# Patient Record
Sex: Female | Born: 1975 | Hispanic: Yes | Marital: Married | Smoking: Current every day smoker
Health system: Southern US, Community
[De-identification: ages and names within clinical notes are randomized; demographics above are authoritative.]

## PROBLEM LIST (undated history)

## (undated) DIAGNOSIS — I10 Essential (primary) hypertension: Secondary | ICD-10-CM

## (undated) DIAGNOSIS — E8881 Metabolic syndrome: Secondary | ICD-10-CM

## (undated) DIAGNOSIS — E88819 Insulin resistance, unspecified: Secondary | ICD-10-CM

## (undated) DIAGNOSIS — E079 Disorder of thyroid, unspecified: Secondary | ICD-10-CM

## (undated) HISTORY — DX: Disorder of thyroid, unspecified: E07.9

## (undated) HISTORY — PX: THYROID SURGERY: SHX805

---

## 2012-09-09 ENCOUNTER — Inpatient Hospital Stay (HOSPITAL_COMMUNITY)
Admission: AD | Admit: 2012-09-09 | Discharge: 2012-09-09 | Disposition: A | Discharge status: Home / Self Care | Payer: Self-pay | Source: Ambulatory Visit | Attending: Obstetrics & Gynecology | Admitting: Obstetrics & Gynecology

## 2012-09-09 ENCOUNTER — Encounter (HOSPITAL_COMMUNITY): Payer: Self-pay

## 2012-09-09 DIAGNOSIS — N898 Other specified noninflammatory disorders of vagina: Secondary | ICD-10-CM

## 2012-09-09 DIAGNOSIS — N949 Unspecified condition associated with female genital organs and menstrual cycle: Secondary | ICD-10-CM | POA: Insufficient documentation

## 2012-09-09 DIAGNOSIS — N938 Other specified abnormal uterine and vaginal bleeding: Secondary | ICD-10-CM | POA: Insufficient documentation

## 2012-09-09 DIAGNOSIS — N939 Abnormal uterine and vaginal bleeding, unspecified: Secondary | ICD-10-CM

## 2012-09-09 HISTORY — DX: Essential (primary) hypertension: I10

## 2012-09-09 LAB — CBC
HCT: 37.7 % (ref 36.0–46.0)
MCV: 86.7 fL (ref 78.0–100.0)
Platelets: 370 10*3/uL (ref 150–400)
RBC: 4.35 MIL/uL (ref 3.87–5.11)
WBC: 11.4 10*3/uL — ABNORMAL HIGH (ref 4.0–10.5)

## 2012-09-09 LAB — POCT PREGNANCY, URINE: Preg Test, Ur: NEGATIVE

## 2012-09-09 MED ORDER — NAPROXEN 500 MG PO TABS: 500.0000 mg | ORAL_TABLET | Freq: Two times a day (BID) | ORAL | Status: DC

## 2012-09-09 NOTE — MAU Provider Note (Signed)
Attestation of Attending Supervision of Advanced Practitioner (CNM/NP): Evaluation and management procedures were performed by the Advanced Practitioner under my supervision and collaboration. I have reviewed the Advanced Practitioner's note and chart, and I agree with the management and plan.  Elesa Garman H. 4:49 PM   

## 2012-09-09 NOTE — MAU Provider Note (Signed)
History     CSN: [id number]  Arrival date and time: 09/09/12 1139   None     Chief Complaint  Patient presents with  . Vaginal Bleeding   HPI This is a 37 y.o. female who presents with c/o bleeding since getting her first DepoProvera shot in April. Denies dizziness. Soaks Minipads, no active hemorrhage. States no one told her this might happen.   RN Note: Patient got DepoProvera on 08/17/12 has had bleeding since then.       OB History   Grav Para Term Preterm Abortions TAB SAB Ect Mult Living   1 1 1       1       Past Medical History  Diagnosis Date  . Hypertension     No past surgical history on file.  No family history on file.  History  Substance Use Topics  . Smoking status: Current Every Day Smoker -- 10 years    Types: Cigarettes  . Smokeless tobacco: Not on file  . Alcohol Use: Yes     Comment: beer    Allergies:  Allergies  Allergen Reactions  . Shellfish Allergy Anaphylaxis    Prescriptions prior to admission  Medication Sig Dispense Refill  . metFORMIN (GLUCOPHAGE) 1000 MG tablet Take 1,000 mg by mouth daily.      . valsartan (DIOVAN) 160 MG tablet Take 160 mg by mouth daily.        Review of Systems  Constitutional: Negative for fever and malaise/fatigue.  Gastrointestinal: Negative for nausea, vomiting, abdominal pain, diarrhea and constipation.  Neurological: Negative for dizziness, weakness and headaches.   Physical Exam   Blood pressure 130/95, pulse 109, temperature 98.8 F (37.1 C), temperature source Oral, resp. rate 20, weight 193 lb (87.544 kg), last menstrual period 08/18/2012.  Physical Exam  Constitutional: She is oriented to person, place, and time. She appears well-developed and well-nourished. No distress.  HENT:  Head: Normocephalic.  Cardiovascular: Normal rate.   Respiratory: Effort normal.  GI: Soft. She exhibits no distension. There is no tenderness.  Musculoskeletal: Normal range of motion.  Neurological: She  is alert and oriented to person, place, and time.  Skin: Skin is warm and dry.  Psychiatric: She has a normal mood and affect.  Pelvic exam deferred  MAU Course  Procedures  MDM Results for orders placed during the hospital encounter of 09/09/12 (from the past 24 hour(s))  POCT PREGNANCY, URINE     Status: None   Collection Time    09/09/12 12:36 PM      Result Value Range   Preg Test, Ur NEGATIVE  NEGATIVE  CBC     Status: Abnormal   Collection Time    09/09/12  3:20 PM      Result Value Range   WBC 11.4 (*) 4.0 - 10.5 K/uL   RBC 4.35  3.87 - 5.11 MIL/uL   Hemoglobin 12.6  12.0 - 15.0 g/dL   HCT 28.4  13.2 - 44.0 %   MCV 86.7  78.0 - 100.0 fL   MCH 29.0  26.0 - 34.0 pg   MCHC 33.4  30.0 - 36.0 g/dL   RDW 10.2  72.5 - 36.6 %   Platelets 370  150 - 400 K/uL     Assessment and Plan  A:  Bleeding after DepoProvera      No anemia  P:  Discussed this is normal during first 3-6 months of DepoProvera       Will rx Naproxen  Followup as needed  [facility] 09/09/2012, 3:50 PM

## 2012-09-09 NOTE — MAU Note (Signed)
Denies dizziness

## 2012-09-09 NOTE — MAU Note (Signed)
Patient left from lobby while preparing AMA form; patient left from lobby.

## 2012-09-09 NOTE — MAU Note (Signed)
Having period for over 15days, is very heavy.    Started April 17, started when expected; got heavier and has continued.   Got a depo injection around the time it started. Never had bleeding like this before.

## 2012-09-09 NOTE — MAU Note (Signed)
Patient got DepoProvera on 08/17/12 has had bleeding since then.

## 2012-09-09 NOTE — MAU Note (Signed)
Changed pad in BR, 1/2 surface area of large panty liner saturated with RBB.  To room #4

## 2014-03-05 ENCOUNTER — Encounter (HOSPITAL_COMMUNITY): Payer: Self-pay

## 2014-11-13 ENCOUNTER — Encounter (HOSPITAL_BASED_OUTPATIENT_CLINIC_OR_DEPARTMENT_OTHER): Payer: Self-pay

## 2014-11-13 ENCOUNTER — Emergency Department (HOSPITAL_BASED_OUTPATIENT_CLINIC_OR_DEPARTMENT_OTHER)
Admission: EM | Admit: 2014-11-13 | Discharge: 2014-11-13 | Disposition: A | Discharge status: Home / Self Care | Payer: Self-pay | Attending: Emergency Medicine | Admitting: Emergency Medicine

## 2014-11-13 DIAGNOSIS — Z720 Tobacco use: Secondary | ICD-10-CM | POA: Insufficient documentation

## 2014-11-13 DIAGNOSIS — I10 Essential (primary) hypertension: Secondary | ICD-10-CM | POA: Insufficient documentation

## 2014-11-13 DIAGNOSIS — Z8639 Personal history of other endocrine, nutritional and metabolic disease: Secondary | ICD-10-CM | POA: Insufficient documentation

## 2014-11-13 DIAGNOSIS — L237 Allergic contact dermatitis due to plants, except food: Secondary | ICD-10-CM | POA: Insufficient documentation

## 2014-11-13 DIAGNOSIS — Z9109 Other allergy status, other than to drugs and biological substances: Secondary | ICD-10-CM

## 2014-11-13 HISTORY — DX: Insulin resistance, unspecified: E88.819

## 2014-11-13 HISTORY — DX: Metabolic syndrome: E88.81

## 2014-11-13 MED ORDER — METHYLPREDNISOLONE SODIUM SUCC 125 MG IJ SOLR
125.0000 mg | Freq: Once | INTRAMUSCULAR | Status: AC
  Administered 2014-11-13: 125 mg via INTRAMUSCULAR
  Filled 2014-11-13: qty 2

## 2014-11-13 MED ORDER — PREDNISONE 20 MG PO TABS: ORAL_TABLET | ORAL | Status: DC

## 2014-11-13 NOTE — Discharge Instructions (Signed)
Take your medications as prescribed. Return to ED for fevers, chills, difficulties breathing, nausea, vomiting, abdominal pain.  Alergias (Allergies) El profesional que lo asiste le ha diagnosticado que usted padece de [place]. Las Medtronic pueden ser ocasionadas por cualquier cosa a la que su organismo es sensible. Pueden ser alimentos, medicamentos, polen, sustancias qumicas y casi cualquiera de las cosas que lo rodean en su vida diaria que producen alrgenos. Un alrgeno es todo lo que hace que una sustancia produzca alergia. La herencia es uno de los factores que causa este problema. Esto significa que usted puede sufrir alguna de las alergias que sufrieron sus [place]. Las Medtronic a la comida pueden ocurrir a Hotel manager. Estn entre las ms graves y Haematologist en peligro la vida. Algunos de los alimentos que comnmente producen [place] son la [place], los frutos de mar, los O'Brien, los frutos secos, el trigo y la soja. SNTOMAS  Hinchazn alrededor de la boca.  Una erupcin roja que produce picazn o urticaria.  Vmitos o diarrea.  Dificultad para respirar. LAS REACCIONES ALRGICAS GRAVES PONEN EN PELIGRO LA VIDA . Esta reaccin se denomina anafilaxis. Puede ocasionar que la boca y la garganta se hinchen y produzca dificultad para respirar y Chartered loss adjuster. En reacciones graves, slo una pequea cantidad del alimento (por ejemplo, aceite de cacahuate en la ensalada) puede producir la muerte en pocos segundos. Las [employer] pueden ocurrir a Hotel manager. Se denominan as porque generalmente se producen durante la misma estacin todos los aos. Puede ser Ardelia Mems reaccin al moho, al polen del csped o al polen de los rboles. Otras causas del problema son los alrgenos que contienen los caros del polvo del hogar, el pelaje de las mascotas y las esporas del moho. Los sntomas consisten en congestin nasal, picazn y secrecin nasal asociada con estornudos, y lagrimeo y [employer] ojos. Tambin puede haber picazn de la boca y los odos. Estos problemas aparecen cuando se entra en contacto con el polen y otros alrgenos. Los alrgenos son las partculas que estn en el aire y a las que el organismo reacciona cuando existe una Risk analyst. Esto hace que usted libere anticuerpos alrgicos. A travs de una cadena de eventos, estos finalmente hacen que usted libere histamina en la corriente sangunea. Aunque esto implica una proteccin para su organismo, es lo que le produce disconfort. Ese es el motivo por el que se le han indicado antihistamnicos para sentirse mejor. Si usted no Lexicographer cul es el alrgeno que le produjo la reaccin, puede someterse a una prueba de [place] o de piel. Las alergias no pueden curarse pero pueden controlarse con medicamentos. La fiebre de heno es un grupo de trastornos alrgicos estacionales Simplemente se tratan con medicamentos de venta libre como difenhidramina (Benadryl). Tome los medicamentos segn las indicaciones. No consuma alcohol ni conduzca mientras toma este medicamento. Consulte con el profesional que lo asiste o siga las instrucciones de uso para las dosis para nios. Si estos medicamentos no le Training and development officer, existen muchos otros nuevos que el profesional que lo asiste puede prescribirle. Podrn utilizarse medicamentos ms fuertes tales como un spray nasal, colirios y corticoides si los primeros medicamentos que prueba no lo [place]. Si todos estos fracasan, puede Risk manager otros tratamientos como la inmunoterapia o las inyecciones desensibilizantes. Haga una consulta de seguimiento con el profesional que lo asiste si los problemas continan. Estas alergias estacionales no ponen en peligro la vida. Generalmente se trata de una incomodidad que puede [place]  con medicamentos. INSTRUCCIONES PARA EL CUIDADO DOMICILIARIO  Si no est seguro de que es lo que le produce la reaccin, Quarry manager un registro de los alimentos que  come y los sntomas que le siguen. Evite los Nurse, mental health.  Si presenta urticaria o una erupcin cutnea:  Tome los medicamentos como se le indic.  Puede utilizar un antihistamnico de venta libre (difenhidramina) para la urticaria y Cabin crew, segn sea necesario.  Aplquese compresas sobre la piel o tome baos de agua fra. Evite los [place] calientes. El calor puede hacer que la urticaria y la picazn empeoren.  Si usted es muy alrgico:  Como consecuencia de un tratamiento para una reaccin grave, puede necesitar ser hospitalizado para recibir un seguimiento intensivo.  Utilice un brazalete o collar de alerta mdico, indicando que usted es Air cabin crew.  Usted y su familia deben aprender a Architectural technologist adrenalina o a Risk manager un kit anafilctico.  Si usted ya ha sufrido una reaccin grave, siempre lleve el kit anafilctico o el EpiPen con usted. Si sufre una reaccin grave, utilice esta medicacin del modo en que se lo indic el profesional que lo asiste. Una falla puede conllevar consecuencias fatales. SOLICITE ATENCIN MDICA SI:  Sospecha que puede sufrir una alergia a algn alimento. Los sntomas generalmente ocurren dentro de los 30 minutos posteriores a haber ingerido el alimento.  Los sntomas persistieron durante 2 das o han empeorado.  Desarrolla nuevos sntomas.  Quiere volver a probar o que su hijo consuma nuevamente un alimento o bebida que usted cree que le causa una reaccin IT consultant. Nunca lo haga si ha sufrido una reaccin anafilctica a ese alimento o a esa bebida con anterioridad. Slo intntelo bajo la supervisin del mdico. SOLICITE ATENCIN MDICA DE INMEDIATO SI:  Presenta dificultad para respirar, jadea o tiene una sensacin de opresin en el pecho o en la garganta.  Tiene la boca hinchada, o presenta urticaria, hinchazn o picazn en todo el cuerpo.  Ha sufrido una reaccin grave que ha respondido a Engineer, technical sales o al EpiPen. Estas reacciones pueden volver a presentarse cuando haya terminado la medicacin. Estas reacciones deben considerarse como que ponen en peligro la vida. EST SEGURO QUE:   Comprende las instrucciones para el alta mdica.  Controlar su enfermedad.  Solicitar atencin mdica de inmediato segn las indicaciones. Document Released: 04/20/2005 Document Revised: 08/15/2012 [facility] Patient Information 2015 [place], [place]. This information is not intended to replace advice given to you by your health care provider. Make sure you discuss any questions you have with your health care provider.

## 2014-11-13 NOTE — ED Provider Notes (Signed)
CSN: [id number]     Arrival date & time 11/13/14  1300 History   First MD Initiated Contact with Patient 11/13/14 1340     Chief Complaint  Patient presents with  . Poison Ivy     (Consider location/radiation/quality/duration/timing/severity/associated sxs/prior Treatment) HPI Antara Brecheisen is a 39 y.o. female who speaks Spanish, history of present illness is translated by son in the room. Son states that patient developed a rash approximately one week ago that they feel is related to poison ivy. They report being outside in the woods when they began to notice the rash on their extremities. The son reports he also has the rash. Denies fevers, chills, nausea or vomiting, abdominal pain, difficulties breathing or swallowing. Denies any insect bites. They have tried Benadryl, calamine lotion without relief of symptoms. Reports rash is very itchy.  Past Medical History  Diagnosis Date  . Hypertension   . Insulin resistance    Past Surgical History  Procedure Laterality Date  . Thyroid surgery     No family history on file. History  Substance Use Topics  . Smoking status: Current Every Day Smoker -- 10 years    Types: Cigarettes  . Smokeless tobacco: Not on file  . Alcohol Use: Yes     Comment: occ   OB History    Gravida Para Term Preterm AB TAB SAB Ectopic Multiple Living   Review of Systems A 10 point review of systems was completed and was negative except for pertinent positives and negatives as mentioned in the history of present illness     Allergies  Shellfish allergy  Home Medications   Prior to Admission medications   Medication Sig Start Date End Date Taking? Authorizing Provider  Levothyroxine Sodium (LEVOTHROID PO) Take by mouth.   Yes Historical Provider, MD  METFORMIN HCL PO Take by mouth.   Yes Historical Provider, MD  predniSONE (DELTASONE) 20 MG tablet 3 tabs po day one, then 2 tabs daily x 4 days 11/13/14   Joycie Peek, PA-C    BP 146/72 mmHg  Pulse 88  Temp(Src) 98.2 F (36.8 C) (Oral)  Resp 18  Ht 5' (1.524 m)  Wt 172 lb (78.019 kg)  BMI 33.59 kg/m2  SpO2 98%  LMP 10/26/2014 Physical Exam  Constitutional:  Awake, alert, nontoxic appearance.  HENT:  Head: Atraumatic.  Mouth/Throat: Oropharynx is clear and moist. No oropharyngeal exudate.  Eyes: Right eye exhibits no discharge. Left eye exhibits no discharge.  Neck: Neck supple.  Cardiovascular: Normal rate, regular rhythm and normal heart sounds.   Pulmonary/Chest: Effort normal. She exhibits no tenderness.  Abdominal: Soft. There is no tenderness. There is no rebound.  Musculoskeletal: She exhibits no tenderness.  Baseline ROM, no obvious new focal weakness.  Neurological:  Mental status and motor strength appears baseline for patient and situation.  Skin:  Diffuse, erythematous rash with linear lesions, consistent with poison ivy.  Psychiatric: She has a normal mood and affect.  Nursing note and vitals reviewed.   ED Course  Procedures (including critical care time) Labs Review Labs Reviewed - No data to display  Imaging Review No results found.   EKG Interpretation None     Meds given in ED:  Medications  methylPREDNISolone sodium succinate (SOLU-MEDROL) 125 mg/2 mL injection 125 mg (not administered)    New Prescriptions   PREDNISONE (DELTASONE) 20 MG TABLET    3 tabs po day one, then  2 tabs daily x 4 days    MDM  Vitals stable - WNL -afebrile Pt resting comfortably in ED. PE--diffuse rash noted throughout extremities. Patent airway and benign abdominal exam.  DDX--patient treated with Solu-Medrol in the ED. Encouraged to continue taking Benadryl at home. Will DC with prednisone taper. Encourage follow-up with primary care. No evidence of other acute or emergent pathology.  I discussed all relevant lab findings and imaging results with pt and they verbalized understanding. Discussed f/u with PCP within 48 hrs and return  precautions, pt very amenable to plan.  Final diagnoses:  Allergy to poison ivy        Joycie PeekBenjamin Jonavin Seder, PA-C 11/13/14 1411  Blake DivineJohn Wofford, MD 11/13/14 [phone]

## 2014-11-13 NOTE — ED Notes (Signed)
Husband interpreting for pt-rash to entire body-feels is poison ivy

## 2014-11-13 NOTE — ED Notes (Signed)
Spanish interpreter called to relay discharge instructions to pt.

## 2014-11-18 ENCOUNTER — Encounter (HOSPITAL_BASED_OUTPATIENT_CLINIC_OR_DEPARTMENT_OTHER): Payer: Self-pay | Admitting: Adult Health

## 2014-11-18 DIAGNOSIS — Z8639 Personal history of other endocrine, nutritional and metabolic disease: Secondary | ICD-10-CM | POA: Insufficient documentation

## 2014-11-18 DIAGNOSIS — I10 Essential (primary) hypertension: Secondary | ICD-10-CM | POA: Insufficient documentation

## 2014-11-18 DIAGNOSIS — L255 Unspecified contact dermatitis due to plants, except food: Secondary | ICD-10-CM | POA: Insufficient documentation

## 2014-11-18 DIAGNOSIS — L03114 Cellulitis of left upper limb: Secondary | ICD-10-CM | POA: Insufficient documentation

## 2014-11-18 DIAGNOSIS — Z720 Tobacco use: Secondary | ICD-10-CM | POA: Insufficient documentation

## 2014-11-18 NOTE — ED Notes (Signed)
Spanish speaking family-presents with rash to upper extremities for 2 weeks-believes it is poison ivy or oak. C/o itchiness

## 2014-11-19 ENCOUNTER — Emergency Department (HOSPITAL_BASED_OUTPATIENT_CLINIC_OR_DEPARTMENT_OTHER)
Admission: EM | Admit: 2014-11-19 | Discharge: 2014-11-19 | Disposition: A | Discharge status: Home / Self Care | Payer: Self-pay | Attending: Emergency Medicine | Admitting: Emergency Medicine

## 2014-11-19 DIAGNOSIS — L03114 Cellulitis of left upper limb: Secondary | ICD-10-CM

## 2014-11-19 DIAGNOSIS — L237 Allergic contact dermatitis due to plants, except food: Secondary | ICD-10-CM

## 2014-11-19 MED ORDER — PREDNISONE 20 MG PO TABS: ORAL_TABLET | ORAL | Status: AC

## 2014-11-19 MED ORDER — DOXYCYCLINE HYCLATE 100 MG PO CAPS: 100.0000 mg | ORAL_CAPSULE | Freq: Two times a day (BID) | ORAL | Status: AC

## 2014-11-19 MED ORDER — DOXYCYCLINE HYCLATE 100 MG PO TABS
100.0000 mg | ORAL_TABLET | Freq: Once | ORAL | Status: AC
  Administered 2014-11-19: 100 mg via ORAL
  Filled 2014-11-19: qty 1

## 2014-11-19 NOTE — ED Provider Notes (Signed)
CSN: [id number]     Arrival date & time 11/18/14  2220 History  This chart was scribed for Emily Libra, MD by Abel Presto, ED Scribe. This patient was seen in room MH03/MH03 and the patient's care was started at 12:38 AM.    Chief Complaint  Patient presents with  . Rash   The history is provided by the patient. No language interpreter was used. Pt speaks spanish; husband is able to interpret.    HPI HPI Comments: Emily Rodgers is a 39 y.o. female who presents to the Emergency Department complaining of pruritic rash to bilateral upper extremities and, less so, to thighs with onset 2 weeks ago worsening 2 days ago. Pt was exposed to poison ivy in yard. She was seen on July 12 and placed on a 5 day prednisone course. She reports transient improvement but symptoms have returned and worsened. Symptoms are moderate to severe. There is associated pruritus. Pt reports fever of 100.4 two days ago which has resolved and notes some drainage from area of redness to left upper arm. Pt denies any other complaints.   Past Medical History  Diagnosis Date  . Hypertension   . Insulin resistance    Past Surgical History  Procedure Laterality Date  . Thyroid surgery     History reviewed. No pertinent family history. History  Substance Use Topics  . Smoking status: Current Every Day Smoker -- 10 years    Types: Cigarettes  . Smokeless tobacco: Not on file  . Alcohol Use: Yes     Comment: occ   OB History    Gravida Para Term Preterm AB TAB SAB Ectopic Multiple Living   Review of Systems A complete 10 system review of systems was obtained and all systems are negative except as noted in the HPI and PMH.     Allergies  Shellfish allergy  Home Medications   Prior to Admission medications   Medication Sig Start Date End Date Taking? Authorizing Provider  Levothyroxine Sodium (LEVOTHROID PO) Take by mouth.    Historical Provider, MD  METFORMIN HCL PO Take by mouth.     Historical Provider, MD  predniSONE (DELTASONE) 20 MG tablet 3 tabs po day one, then 2 tabs daily x 4 days 11/13/14   Joycie Peek, PA-C   BP 129/88 mmHg  Pulse 86  Temp(Src) 98.7 F (37.1 C) (Oral)  Resp 16  Ht  (1.549 m)  Wt 172 lb (78.019 kg)  BMI 32.52 kg/m2  SpO2 100%  LMP 10/26/2014   Physical Exam General: Well-developed, well-nourished female in no acute distress; appearance consistent with age of record HENT: normocephalic; atraumatic Eyes: pupils equal, round and reactive to light; extraocular muscles intact Neck: supple Heart: regular rate and rhythm Lungs: clear to auscultation bilaterally Abdomen: soft; nondistended; nontender; no masses or hepatosplenomegaly; bowel sounds present Extremities: No deformity; full range of motion; pulses normal Neurologic: Awake, alert; motor function intact in all extremities and symmetric; no facial droop Skin: Warm and dry; dermatitis of left forearm with areas of confluence and erythema of the left antecubital fossa; scattered dermatitis of RUE and a few scattered lesions of bilateral LEs:        Psychiatric: Normal mood and affect Nursing note and vitals reviewed.   ED Course  Procedures (including critical care time) DIAGNOSTIC STUDIES: Oxygen Saturation is 100% on room air, normal by my interpretation.    COORDINATION OF CARE:  12:47 AM Discussed treatment plan with patient at beside, the patient agrees with the plan and has no further questions at this time.   MDM  I believe the patient's prednisone course was insufficiently long to suppress her symptoms and she has had a rebound. I am further concerned that she has developed a secondary cellulitis of the left antecubital fossa. We will place her on a longer prednisone course and in a biotic.    Emily LibraJohn Trevaris Pennella, MD 11/19/14 [phone]

## 2015-04-09 ENCOUNTER — Ambulatory Visit: Payer: Self-pay | Admitting: Family Medicine

## 2019-10-23 ENCOUNTER — Inpatient Hospital Stay
Admission: RE | Admit: 2019-10-23 | Discharge: 2019-10-23 | Disposition: A | Discharge status: Home / Self Care | Payer: Self-pay | Source: Ambulatory Visit | Attending: *Deleted | Admitting: *Deleted

## 2019-10-23 ENCOUNTER — Other Ambulatory Visit: Payer: Self-pay | Admitting: *Deleted

## 2019-10-23 DIAGNOSIS — Z1231 Encounter for screening mammogram for malignant neoplasm of breast: Secondary | ICD-10-CM

## 2019-11-15 ENCOUNTER — Ambulatory Visit: Payer: Self-pay | Attending: Oncology | Admitting: *Deleted

## 2019-11-15 ENCOUNTER — Encounter: Payer: Self-pay | Admitting: *Deleted

## 2019-11-15 ENCOUNTER — Other Ambulatory Visit: Payer: Self-pay

## 2019-11-15 VITALS — BP 129/55 | HR 79 | Temp 98.7°F | Resp 20 | Ht 60.0 in | Wt 186.3 lb

## 2019-11-15 DIAGNOSIS — N6489 Other specified disorders of breast: Secondary | ICD-10-CM

## 2019-11-15 NOTE — Patient Instructions (Signed)
Gave patient hand-out, Women Staying Healthy, Active and Well from [facility], with education on breast health, pap smears, heart and colon health. 

## 2019-11-15 NOTE — Progress Notes (Signed)
  Subjective:     Patient ID: Emily Rodgers, female   DOB: 01-Apr-1976, 44 y.o.   MRN: [id number]  HPI   [facility] Medical History Record - 11/15/19 1406      Breast History   Screening cycle New    CBE Date 11/15/19    Provider (CBE) [facility]    Last Mammogram Annual    Last Mammogram Date 07/12/19    Provider (Mammogram)  [facility]    Recent Breast Symptoms None      Breast Cancer History   Breast Cancer History No personal or family history      Previous History of Breast Problems   Breast Surgery or Biopsy None    Breast Implants N/A    BSE Done Monthly      Gynecological/Obstetrical History   LMP 10/31/19    Is there any chance that the client could be pregnant?  No    PAP smear history Annually    Date of last PAP  05/19/18    Provider (PAP) [facility]    Breast fed children No    DES Exposure No    Cervical, Uterine or Ovarian cancer No    Family history of Cervial, Uterine or Ovarian cancer No    Hysterectomy No    Cervix removed No    Ovaries removed No    Laser/Cryosurgery No    Current method of birth control Other (see comments)    Current method of Estrogen/Hormone replacement None    Smoking history None             Review of Systems     Objective:   Physical Exam Chest:     Breasts: Breasts are asymmetrical.        Right: No swelling, bleeding, inverted nipple, mass, nipple discharge, skin change or tenderness.        Left: No swelling, bleeding, inverted nipple, mass, nipple discharge, skin change or tenderness.       Comments: Left breast larger than the right.   Lymphadenopathy:     Upper Body:     Right upper body: No supraclavicular or axillary adenopathy.     Left upper body: No supraclavicular or axillary adenopathy.        Assessment:     44 year old Hispanic female presents to [facility] today.  Emily Rodgers, the interpreter present during the interview and exam.  In review of patient's records it is noted that she had a  mammogram in February of 2021 that was a birads 0 for bilateral asymmetry.  Patient states she went to [facility] for the work-up but was told it would be $2000 for the mammogram and ultrasound.  Clinical breast exam unremarkable.  Taught self breast awareness.  Last pap smear per patient was in 2020 at [facility].  Next pap due per [facility] guidelines.    Plan:     Patient will need bilateral diagnostic mammogram and ultrasound for work of bilateral asymmetries.  Appointment scheduled for 11/29/19 @ 9:20.  Will follow up per [facility] protocol.

## 2019-11-15 NOTE — Progress Notes (Signed)
0

## 2019-11-29 ENCOUNTER — Ambulatory Visit
Admission: RE | Admit: 2019-11-29 | Discharge: 2019-11-29 | Disposition: A | Discharge status: Home / Self Care | Payer: Self-pay | Source: Ambulatory Visit | Attending: Oncology | Admitting: Oncology

## 2019-11-29 DIAGNOSIS — N6489 Other specified disorders of breast: Secondary | ICD-10-CM | POA: Insufficient documentation

## 2019-11-30 ENCOUNTER — Encounter: Payer: Self-pay | Admitting: *Deleted

## 2019-11-30 NOTE — Progress Notes (Signed)
Letter mailed from the [facility] to inform patient of her normal mammogram results.  Patient is to follow-up with annual screening in one year. 

## 2020-11-28 ENCOUNTER — Other Ambulatory Visit: Payer: Self-pay

## 2020-11-28 ENCOUNTER — Emergency Department
Admission: EM | Admit: 2020-11-28 | Discharge: 2020-11-28 | Disposition: A | Discharge status: Home / Self Care | Payer: Self-pay | Attending: Emergency Medicine | Admitting: Emergency Medicine

## 2020-11-28 DIAGNOSIS — S0081XA Abrasion of other part of head, initial encounter: Secondary | ICD-10-CM | POA: Insufficient documentation

## 2020-11-28 DIAGNOSIS — Z5321 Procedure and treatment not carried out due to patient leaving prior to being seen by health care provider: Secondary | ICD-10-CM | POA: Insufficient documentation

## 2020-11-28 DIAGNOSIS — Y92410 Unspecified street and highway as the place of occurrence of the external cause: Secondary | ICD-10-CM | POA: Insufficient documentation
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
## 2020-12-20 ENCOUNTER — Other Ambulatory Visit: Payer: Self-pay

## 2020-12-20 ENCOUNTER — Emergency Department
Admission: EM | Admit: 2020-12-20 | Discharge: 2020-12-20 | Disposition: A | Discharge status: Home / Self Care | Payer: Self-pay | Attending: Emergency Medicine | Admitting: Emergency Medicine

## 2020-12-20 ENCOUNTER — Emergency Department: Payer: Self-pay

## 2020-12-20 DIAGNOSIS — R1110 Vomiting, unspecified: Secondary | ICD-10-CM | POA: Insufficient documentation

## 2020-12-20 DIAGNOSIS — R0602 Shortness of breath: Secondary | ICD-10-CM | POA: Insufficient documentation

## 2020-12-20 DIAGNOSIS — Z5321 Procedure and treatment not carried out due to patient leaving prior to being seen by health care provider: Secondary | ICD-10-CM | POA: Insufficient documentation

## 2020-12-20 DIAGNOSIS — R079 Chest pain, unspecified: Secondary | ICD-10-CM | POA: Insufficient documentation

## 2020-12-20 LAB — CBC
HCT: 39.8 % (ref 36.0–46.0)
Hemoglobin: 13.5 g/dL (ref 12.0–15.0)
MCH: 29.2 pg (ref 26.0–34.0)
MCHC: 33.9 g/dL (ref 30.0–36.0)
MCV: 86 fL (ref 80.0–100.0)
Platelets: 325 10*3/uL (ref 150–400)
RBC: 4.63 MIL/uL (ref 3.87–5.11)
RDW: 14.3 % (ref 11.5–15.5)
WBC: 11.9 10*3/uL — ABNORMAL HIGH (ref 4.0–10.5)
nRBC: 0 % (ref 0.0–0.2)

## 2020-12-20 LAB — BASIC METABOLIC PANEL
Anion gap: 8 (ref 5–15)
BUN: 21 mg/dL — ABNORMAL HIGH (ref 6–20)
CO2: 21 mmol/L — ABNORMAL LOW (ref 22–32)
Calcium: 8.5 mg/dL — ABNORMAL LOW (ref 8.9–10.3)
Chloride: 106 mmol/L (ref 98–111)
Creatinine, Ser: 0.61 mg/dL (ref 0.44–1.00)
GFR, Estimated: >60 mL/min (ref >60)
Glucose, Bld: 185 mg/dL — ABNORMAL HIGH (ref 70–99)
Potassium: 3.3 mmol/L — ABNORMAL LOW (ref 3.5–5.1)
Sodium: 135 mmol/L (ref 135–145)

## 2020-12-20 LAB — TROPONIN I (HIGH SENSITIVITY)
Troponin I (High Sensitivity): 5 ng/L (ref <18)
Troponin I (High Sensitivity): 3 ng/L (ref <18)

## 2020-12-20 MED ORDER — ONDANSETRON 4 MG PO TBDP
4.0000 mg | ORAL_TABLET | Freq: Once | ORAL | Status: AC | PRN
  Administered 2020-12-20: 4 mg via ORAL
  Filled 2020-12-20: qty 1

## 2020-12-20 NOTE — ED Triage Notes (Addendum)
(  via spanish interpreter) Pt reports about an hour ago she had onset of cp, shortness of breath, vomiting. She reports taking a CBD gummy around 2230 last night.

## 2020-12-20 NOTE — ED Notes (Signed)
Patient transported to CT 

## 2021-01-08 ENCOUNTER — Ambulatory Visit: Payer: Self-pay | Attending: Oncology

## 2021-01-08 ENCOUNTER — Encounter (INDEPENDENT_AMBULATORY_CARE_PROVIDER_SITE_OTHER): Payer: Self-pay

## 2021-01-08 ENCOUNTER — Ambulatory Visit
Admission: RE | Admit: 2021-01-08 | Discharge: 2021-01-08 | Disposition: A | Discharge status: Home / Self Care | Payer: Self-pay | Source: Ambulatory Visit | Attending: Oncology | Admitting: Oncology

## 2021-01-08 ENCOUNTER — Encounter: Payer: Self-pay | Admitting: Physician Assistant

## 2021-01-08 ENCOUNTER — Other Ambulatory Visit: Payer: Self-pay

## 2021-01-08 VITALS — BP 134/89 | HR 99 | Temp 98.9°F | Ht 60.5 in | Wt 197.0 lb

## 2021-01-08 DIAGNOSIS — Z0000 Encounter for general adult medical examination without abnormal findings: Secondary | ICD-10-CM

## 2021-01-08 NOTE — Progress Notes (Signed)
  Subjective:     Patient ID: Emily Rodgers, female   DOB: January 10, 1976, 45 y.o.   MRN: [id number]  HPI   Review of Systems     Objective:   Physical Exam Chest:  Breasts:    Right: No swelling, bleeding, inverted nipple, mass, nipple discharge, skin change or tenderness.     Left: No swelling, bleeding, inverted nipple, mass, nipple discharge, skin change or tenderness.       Assessment:     45 year old Hispanic patient returned for [facility] screening.  Delos Haring interpreted exam.  Patient screened, and meets [facility] eligibility.  Patient does not have insurance, Medicare or Medicaid.  Instructed patient on breast self awareness using teach back method.  Clinical breast exam unremarkable. No mass or lump palpated.   Risk Assessment     Risk Scores       01/08/2021 11/15/2019   Last edited by: Jim Like, RN Neita Garnet, CMA   5-year risk: 0.8 % 0.7 %   Lifetime risk: 8 % 8.1 %               Plan:     Sent for bilateral screening mammogram.

## 2021-01-10 NOTE — Progress Notes (Signed)
Letter mailed from [facility] to notify of normal mammogram results.  Patient to return in one year for annual screening.  Copy to [facility]. 

## 2022-01-09 ENCOUNTER — Other Ambulatory Visit: Payer: Self-pay

## 2022-01-09 DIAGNOSIS — Z1231 Encounter for screening mammogram for malignant neoplasm of breast: Secondary | ICD-10-CM

## 2022-01-13 ENCOUNTER — Ambulatory Visit: Payer: Self-pay

## 2022-01-27 ENCOUNTER — Ambulatory Visit: Payer: Self-pay

## 2022-02-24 ENCOUNTER — Ambulatory Visit: Payer: Self-pay

## 2022-04-21 ENCOUNTER — Ambulatory Visit: Payer: Self-pay

## 2022-05-19 ENCOUNTER — Encounter: Payer: Self-pay | Admitting: Hematology and Oncology

## 2022-05-19 ENCOUNTER — Ambulatory Visit: Payer: Self-pay | Attending: Hematology and Oncology | Admitting: Hematology and Oncology

## 2022-05-19 ENCOUNTER — Ambulatory Visit
Admission: RE | Admit: 2022-05-19 | Discharge: 2022-05-19 | Disposition: A | Discharge status: Home / Self Care | Payer: Self-pay | Source: Ambulatory Visit | Attending: Obstetrics and Gynecology | Admitting: Obstetrics and Gynecology

## 2022-05-19 VITALS — BP 121/88 | Wt 197.6 lb

## 2022-05-19 DIAGNOSIS — Z1231 Encounter for screening mammogram for malignant neoplasm of breast: Secondary | ICD-10-CM | POA: Insufficient documentation

## 2022-05-19 NOTE — Progress Notes (Signed)
Ms. Timmi Devora is a 47 y.o. female who presents to [address] [facility] clinic today with no complaints.    Pap Smear: Pap not smear completed today. Last Pap smear was 2022 at [facility] clinic and was normal. Per patient has no history of an abnormal Pap smear. Last Pap smear result is available in Epic.   Physical exam: Breasts Breasts symmetrical. No skin abnormalities bilateral breasts. No nipple retraction bilateral breasts. No nipple discharge bilateral breasts. No lymphadenopathy. No lumps palpated bilateral breasts.     MS DIGITAL SCREENING TOMO BILATERAL  Result Date: 01/09/2021 CLINICAL DATA:  Screening. EXAM: DIGITAL SCREENING BILATERAL MAMMOGRAM WITH TOMOSYNTHESIS AND CAD TECHNIQUE: Bilateral screening digital craniocaudal and mediolateral oblique mammograms were obtained. Bilateral screening digital breast tomosynthesis was performed. The images were evaluated with computer-aided detection. COMPARISON:  Previous exam(s). ACR Breast Density Category b: There are scattered areas of fibroglandular density. FINDINGS: There are no findings suspicious for malignancy. 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 IMPRESSION: No mammographic evidence of malignancy. A result letter of this screening mammogram will be mailed directly to the patient. RECOMMENDATION: Screening mammogram in one year. (Code:SM-B-01Y) BI-RADS CATEGORY  2: Benign. Electronically Signed   By: Valentino Saxon M.D.   On: 01/09/2021 17:06  MS DIGITAL DIAG TOMO BILAT  Result Date: 11/29/2019 CLINICAL DATA:  47 year old female presenting as a recall from baseline screening for possible bilateral asymmetries. EXAM: DIGITAL DIAGNOSTIC BILATERAL MAMMOGRAM WITH TOMO COMPARISON:  Previous exam(s). ACR Breast Density Category b: There are scattered areas of fibroglandular density. FINDINGS: Right breast: Spot compression tomosynthesis views were performed in  addition to standard views. The questioned asymmetry in the upper-outer quadrant of the right breast is not persist on the additional spot imaging, consistent with normal overlapping fibroglandular tissue. Spot compression tomosynthesis views of the medial aspect of the right breast demonstrate an oval circumscribed mass measuring 0.8 cm. There are additional numerous bilateral oval circumscribed masses, which given multiplicity and bilaterality are considered benign. Left breast: No suspicious mass, distortion, or microcalcifications are identified to suggest presence of malignancy. No definite asymmetry persists today on the full field tomosynthesis views. IMPRESSION: No mammographic evidence of malignancy in the bilateral breasts. RECOMMENDATION: Screening mammogram in one year.(Code:SM-B-01Y) I have discussed the findings and recommendations with the patient. If applicable, a reminder letter will be sent to the patient regarding the next appointment. BI-RADS CATEGORY  2: Benign. Electronically Signed   By: Audie Pinto M.D.   On: 11/29/2019 10:04      Pelvic/Bimanual Pap is not indicated today    Smoking History: Patient has is a former smoker having quit in 2020 and was not referred to quit line.    Patient Navigation: Patient education provided. Access to services provided for patient through [place] interpreter provided. No transportation provided   Colorectal Cancer Screening: Per patient has never had colonoscopy completed No complaints today. FIT test given per [facility]   Breast and Cervical Cancer Risk Assessment: Patient does not have family history of breast cancer, known genetic mutations, or radiation treatment to the chest before age 80. Patient does not have history of cervical dysplasia, immunocompromised, or DES exposure in-utero.  Risk Assessment   No risk assessment data for the current encounter  Risk Scores       01/08/2021   Last  edited by: Rico Junker, RN   5-year risk: 0.8 %   Lifetime risk: 8 %  A: [facility] exam without pap smear No complaints with benign exam.   P: [facility]. Appointment scheduled 05/19/22.  Dayton Scrape A, NP 05/19/2022 9:07 AM

## 2022-05-19 NOTE — Patient Instructions (Signed)
Taught Emily Rodgers about BSE and gave educational materials to take home. Patient did not need a Pap smear today due to last Pap smear was in 2022 per patient. She will be due in 2027. Let her know [facility] will cover Pap smears every  years unless has a history of abnormal Pap smears. [facility]. Appointment scheduled for 05/19/22. Patient aware of appointment and will be there. Let patient know will follow up with her within the next couple weeks with results. Emily Rodgers verbalized understanding.  Melodye Ped, NP 9:09 AM

## 2022-08-11 IMAGING — CR DG CHEST 2V
1 series · 2 of 2 positions shown · non-contrast
Comparison: None.

CLINICAL DATA: Chest pain with shortness of breath and vomiting.

EXAM:
CHEST - 2 VIEW

[Series 1: dg chest 2 view · 0.14mm/px · 2 of 2 slices shown]
[im 1/2]
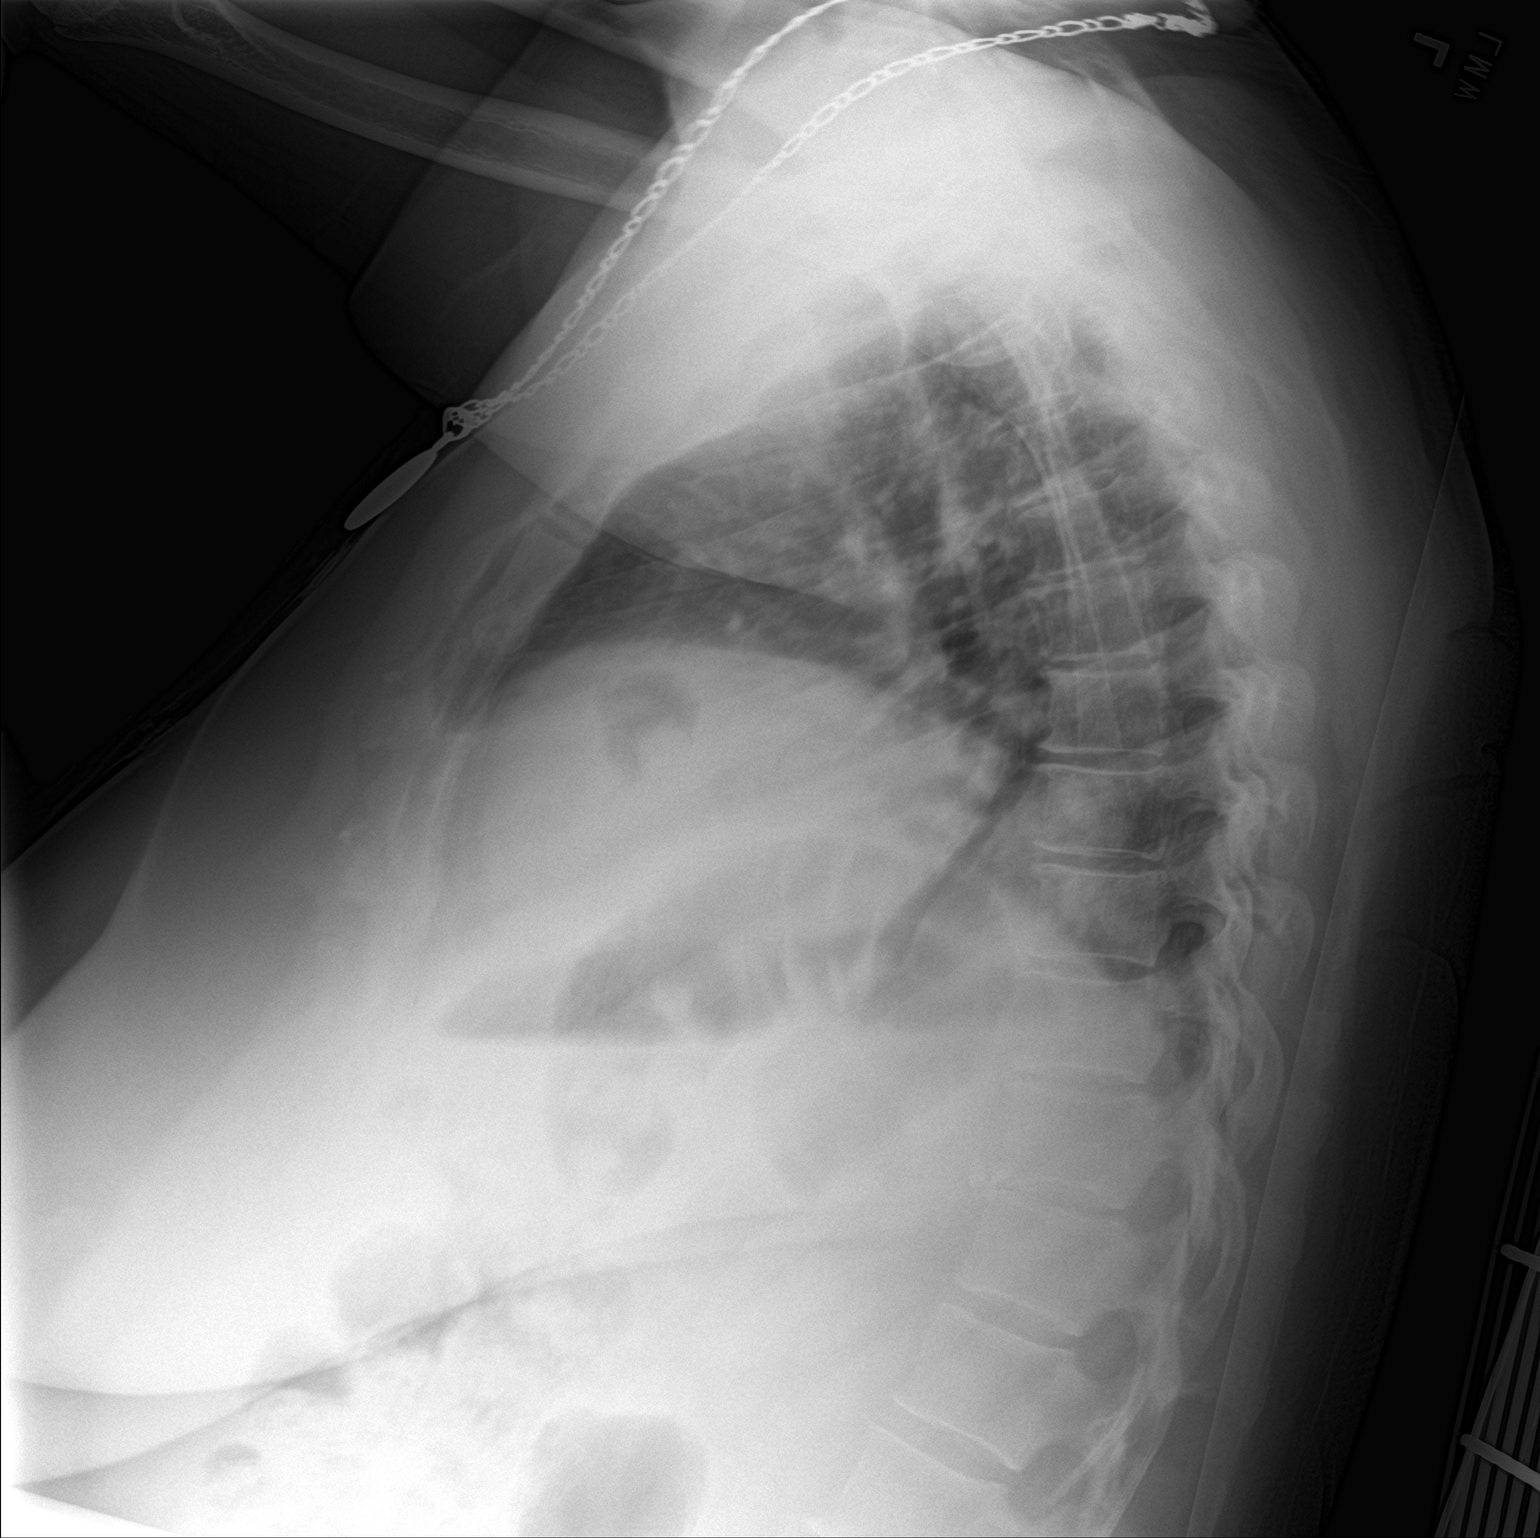
[im 2/2]
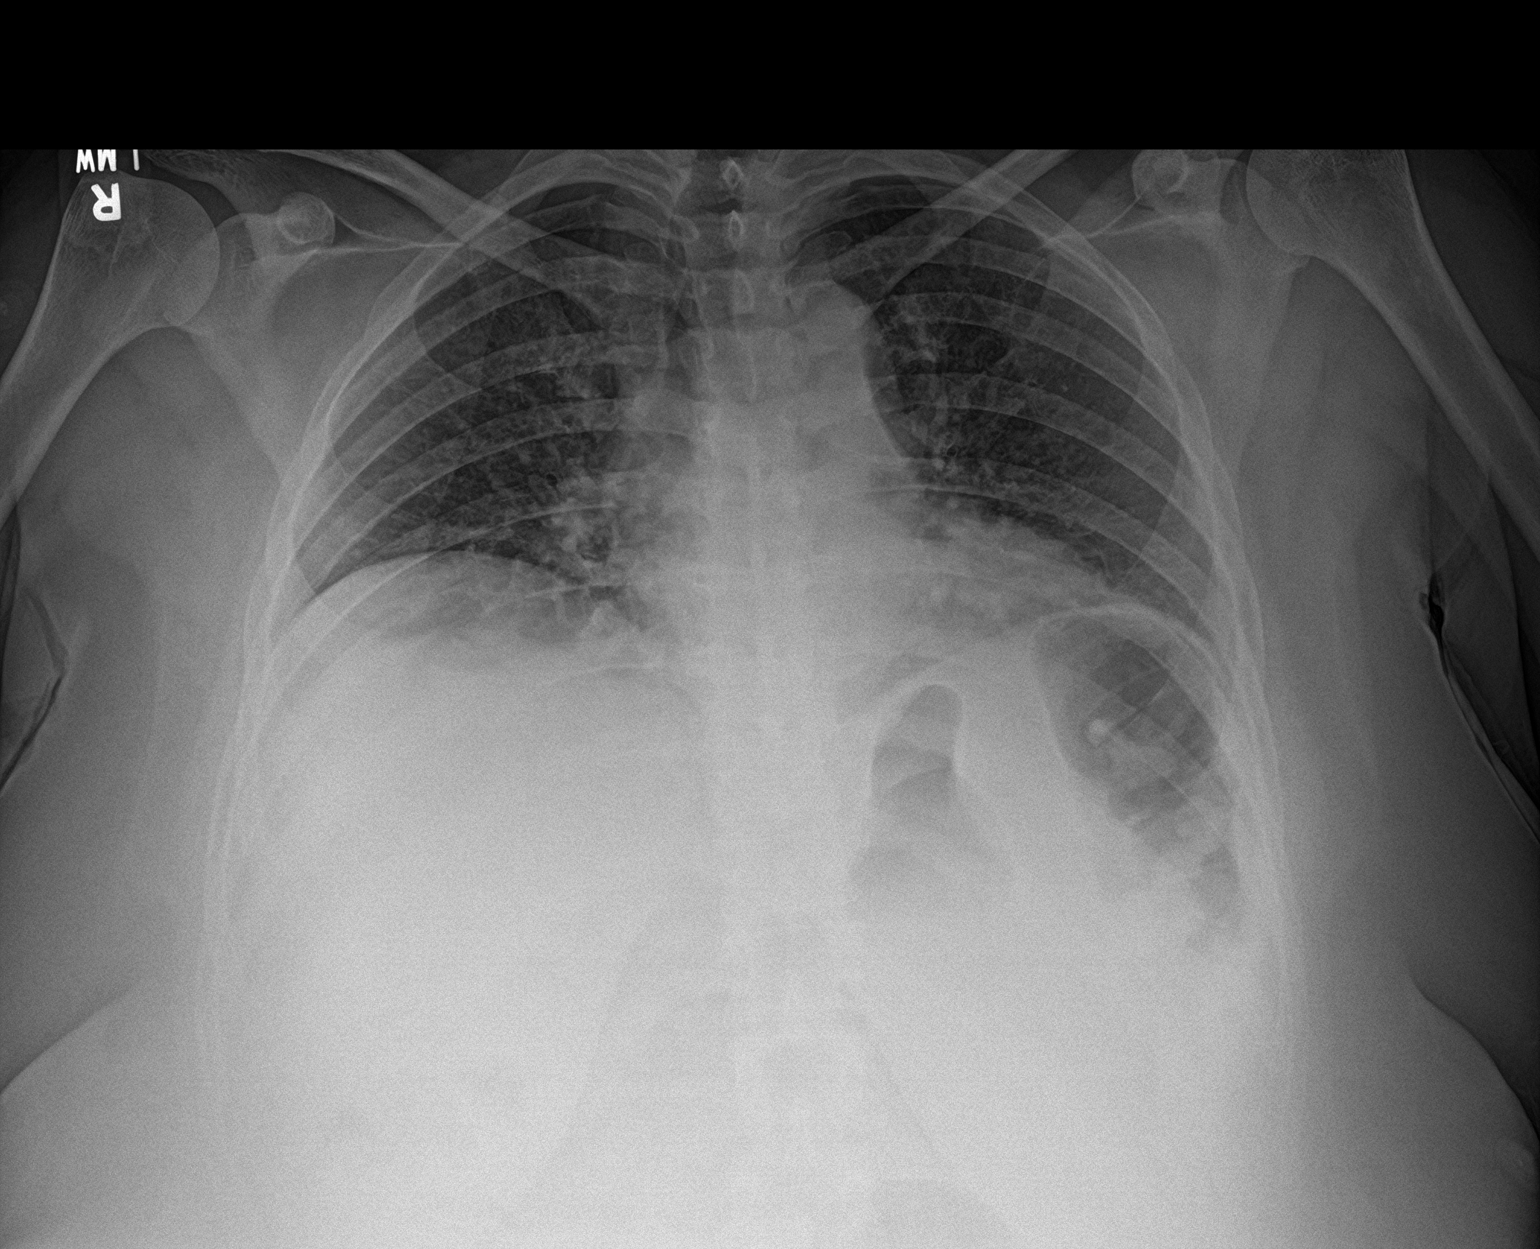

[2 of 2 positions shown; findings below may reference images not displayed]

FINDINGS: Low lung volumes are seen with subsequent crowding of the
bronchovascular lung markings. Mild atelectatic changes are also
seen within the infrahilar regions, bilaterally. There is no
evidence of a pleural effusion or pneumothorax. The heart size and
mediastinal contours are within normal limits. The visualized
skeletal structures are unremarkable.
IMPRESSION: Low lung volumes with mild bilateral infrahilar atelectasis.

## 2022-08-30 IMAGING — MG MM DIGITAL SCREENING BILAT W/ TOMO AND CAD
8 series · 8 of 24 positions shown · non-contrast
Comparison: Previous exam(s).

CLINICAL DATA: Screening.

EXAM:
DIGITAL SCREENING BILATERAL MAMMOGRAM WITH TOMOSYNTHESIS AND CAD
TECHNIQUE: Bilateral screening digital craniocaudal and mediolateral oblique
mammograms were obtained. Bilateral screening digital breast
tomosynthesis was performed. The images were evaluated with
computer-aided detection.

[L MLO synth-2D]
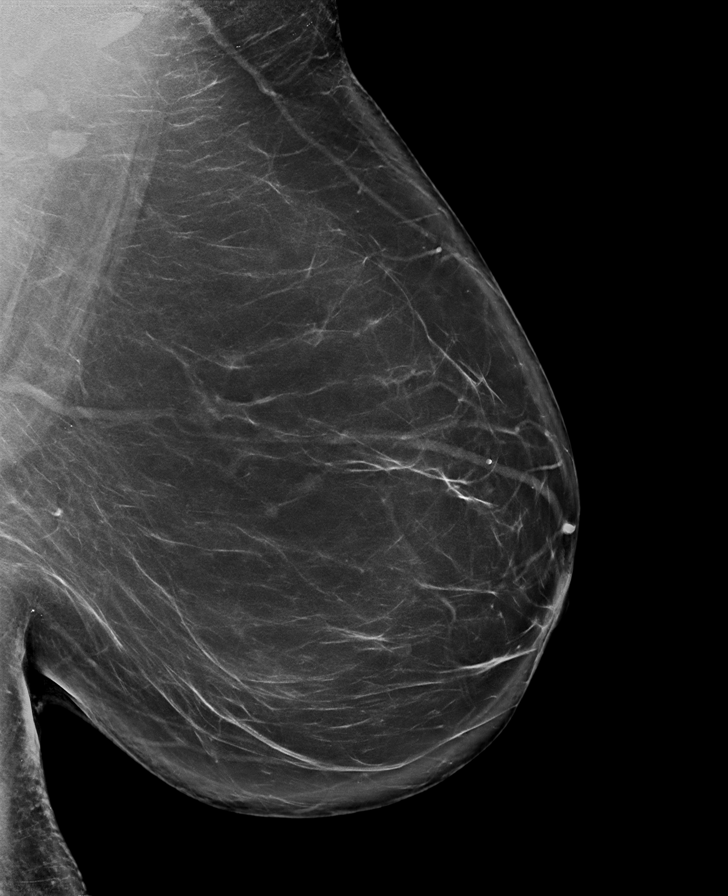

[R CC synth-2D]
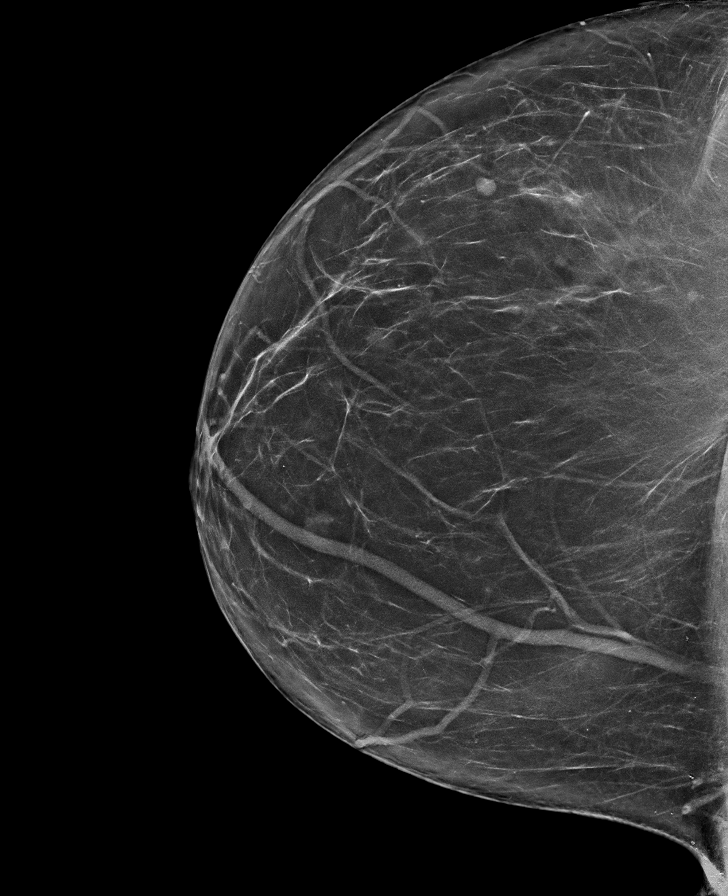

[L CC synth-2D]
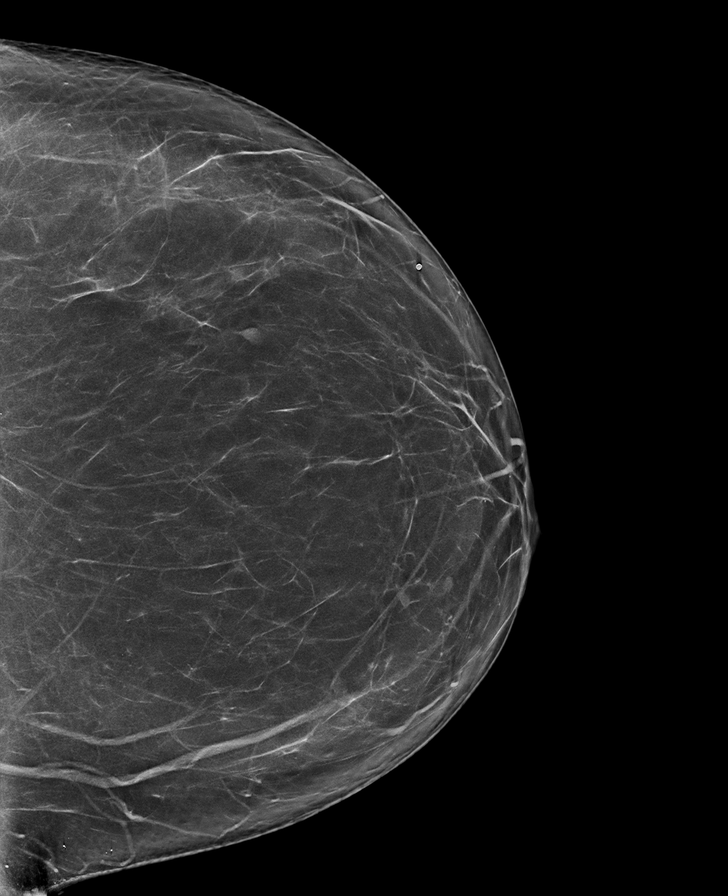

[R MLO synth-2D]
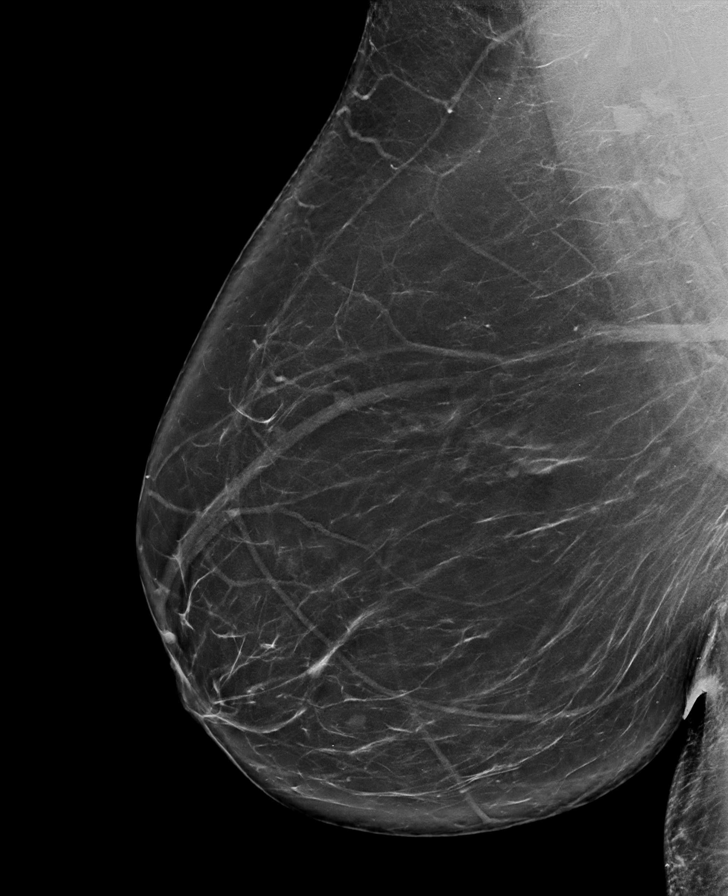

[R MLO tomo · tomo slice 47/93.0]
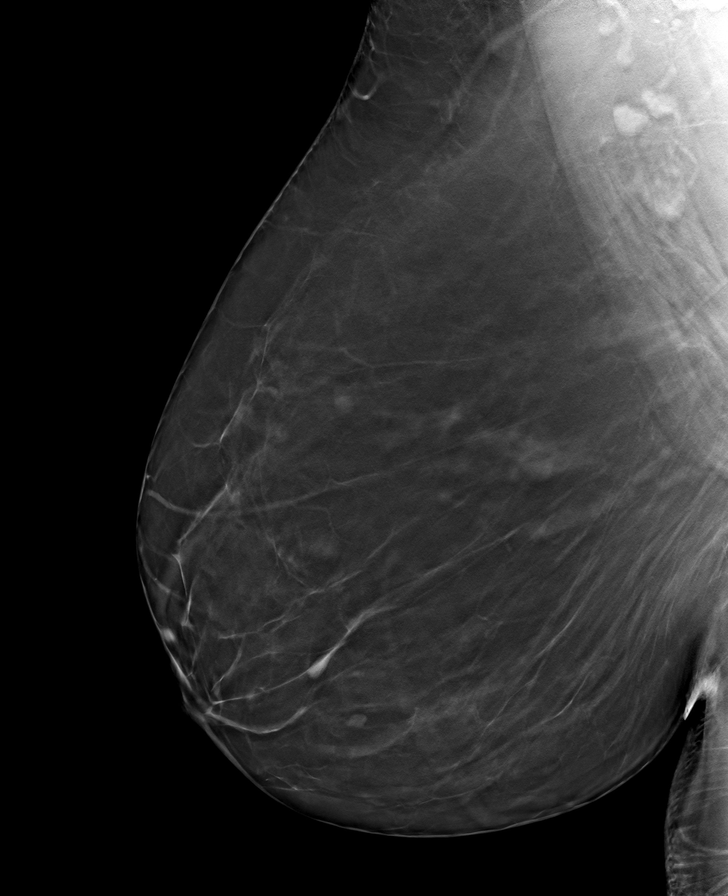

[L MLO tomo · tomo slice 49/98.0]
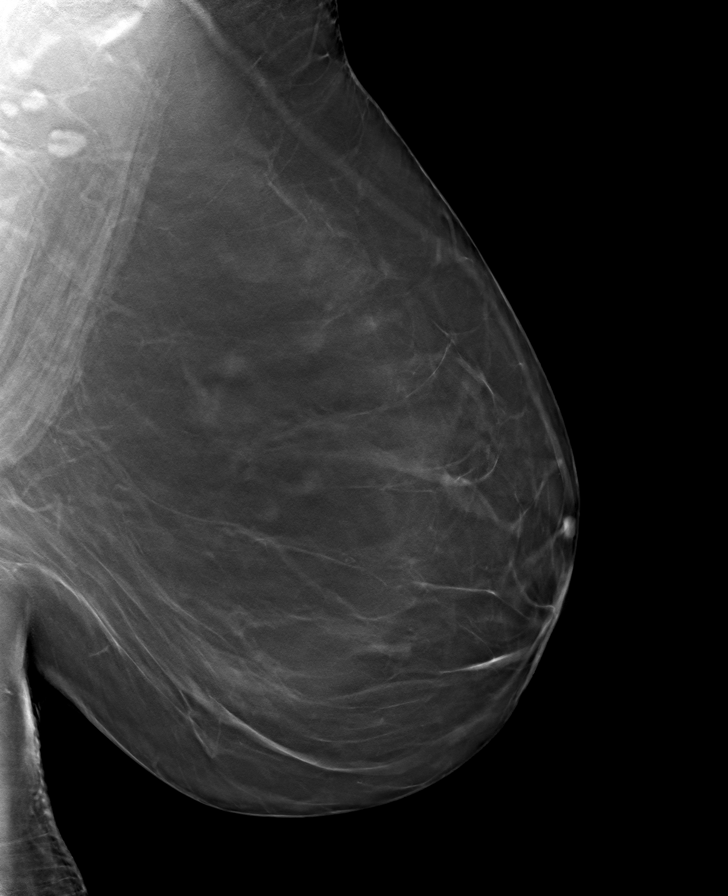

[R CC tomo · tomo slice 41/82.0]
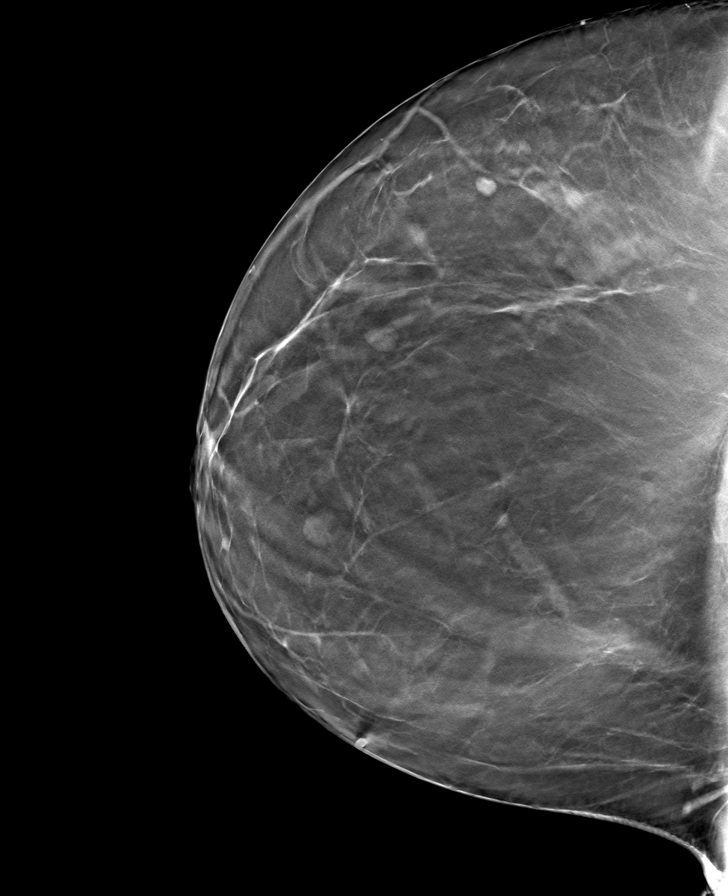

[L CC tomo · tomo slice 41/82.0]
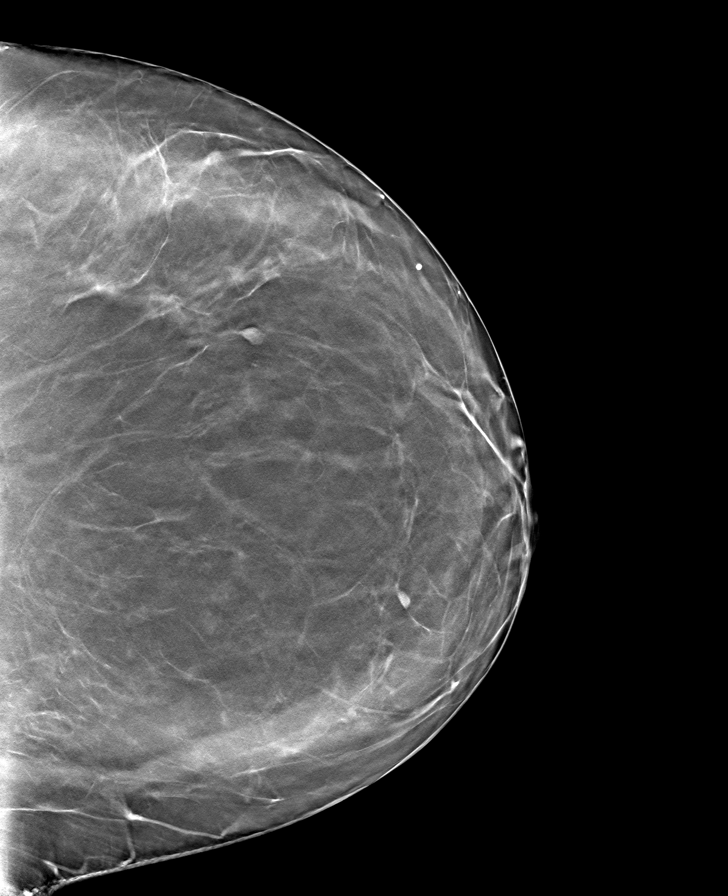

[8 of 24 positions shown; findings below may reference images not displayed]

ACR Breast Density Category b: There are scattered areas of
fibroglandular density.
FINDINGS: There are no findings suspicious for malignancy. There are multiple
bilateral round and oval circumscribed masses, most consistent with
benign fibroadenomas or cysts.
IMPRESSION: No mammographic evidence of malignancy. A result letter of this
screening mammogram will be mailed directly to the patient.

RECOMMENDATION:
Screening mammogram in one year. (Code:VI-L-5SD)

BI-RADS CATEGORY  2: Benign.

## 2023-05-17 ENCOUNTER — Telehealth: Payer: Self-pay | Admitting: Hematology and Oncology

## 2023-06-07 ENCOUNTER — Other Ambulatory Visit: Payer: Self-pay

## 2023-06-07 DIAGNOSIS — Z1231 Encounter for screening mammogram for malignant neoplasm of breast: Secondary | ICD-10-CM

## 2023-06-16 ENCOUNTER — Ambulatory Visit
Admission: RE | Admit: 2023-06-16 | Discharge: 2023-06-16 | Disposition: A | Discharge status: Home / Self Care | Payer: Self-pay | Source: Ambulatory Visit

## 2023-06-16 DIAGNOSIS — Z1231 Encounter for screening mammogram for malignant neoplasm of breast: Secondary | ICD-10-CM | POA: Insufficient documentation
